# Patient Record
Sex: Male | Born: 1960 | Race: White | Hispanic: No | Marital: Married | Smoking: Former smoker
Health system: Southern US, Community
[De-identification: ages and names within clinical notes are randomized; demographics above are authoritative.]

## PROBLEM LIST (undated history)

## (undated) DIAGNOSIS — Q250 Patent ductus arteriosus: Secondary | ICD-10-CM

## (undated) DIAGNOSIS — G809 Cerebral palsy, unspecified: Secondary | ICD-10-CM

## (undated) DIAGNOSIS — I10 Essential (primary) hypertension: Secondary | ICD-10-CM

## (undated) HISTORY — PX: TOE FUSION: SHX1070

## (undated) HISTORY — PX: FOOT CAPSULE RELEASE W/ PERCUTANEOUS HEEL CORD LENGTHENING, TIBIAL TENDON TRANSFER: SHX1658

## (undated) HISTORY — PX: PATENT DUCTUS ARTERIOUS REPAIR: SHX269

## (undated) HISTORY — PX: HIP SURGERY: SHX245

## (undated) HISTORY — PX: LAPAROSCOPY ABDOMEN DIAGNOSTIC: PRO50

## (undated) HISTORY — PX: OTHER SURGICAL HISTORY: SHX169

## (undated) HISTORY — PX: SURGERY SCROTAL / TESTICULAR: SUR1316

---

## 1998-04-18 ENCOUNTER — Ambulatory Visit (HOSPITAL_COMMUNITY): Admission: RE | Admit: 1998-04-18 | Discharge: 1998-04-18 | Payer: Self-pay | Admitting: Family Medicine

## 2016-03-10 ENCOUNTER — Other Ambulatory Visit: Payer: Self-pay

## 2016-03-10 ENCOUNTER — Encounter (HOSPITAL_BASED_OUTPATIENT_CLINIC_OR_DEPARTMENT_OTHER): Payer: Self-pay | Admitting: *Deleted

## 2016-03-10 ENCOUNTER — Emergency Department (HOSPITAL_BASED_OUTPATIENT_CLINIC_OR_DEPARTMENT_OTHER)
Admission: EM | Admit: 2016-03-10 | Discharge: 2016-03-10 | Disposition: A | Discharge status: Home / Self Care | Payer: 59 | Attending: Emergency Medicine | Admitting: Emergency Medicine

## 2016-03-10 ENCOUNTER — Emergency Department (HOSPITAL_BASED_OUTPATIENT_CLINIC_OR_DEPARTMENT_OTHER): Payer: 59

## 2016-03-10 DIAGNOSIS — Z87891 Personal history of nicotine dependence: Secondary | ICD-10-CM | POA: Diagnosis not present

## 2016-03-10 DIAGNOSIS — I10 Essential (primary) hypertension: Secondary | ICD-10-CM | POA: Diagnosis not present

## 2016-03-10 DIAGNOSIS — R55 Syncope and collapse: Secondary | ICD-10-CM | POA: Diagnosis present

## 2016-03-10 HISTORY — DX: Essential (primary) hypertension: I10

## 2016-03-10 HISTORY — DX: Cerebral palsy, unspecified: G80.9

## 2016-03-10 HISTORY — DX: Patent ductus arteriosus: Q25.0

## 2016-03-10 LAB — COMPREHENSIVE METABOLIC PANEL
ALBUMIN: 4.2 g/dL (ref 3.5–5.0)
ALT: 19 U/L (ref 17–63)
AST: 23 U/L (ref 15–41)
Alkaline Phosphatase: 57 U/L (ref 38–126)
Anion gap: 8 (ref 5–15)
BUN: 24 mg/dL — AB (ref 6–20)
CALCIUM: 9 mg/dL (ref 8.9–10.3)
CO2: 26 mmol/L (ref 22–32)
Chloride: 103 mmol/L (ref 101–111)
Creatinine, Ser: 1.11 mg/dL (ref 0.61–1.24)
GFR calc Af Amer: >60 mL/min (ref >60)
GFR calc non Af Amer: >60 mL/min (ref >60)
GLUCOSE: 101 mg/dL — AB (ref 65–99)
Potassium: 4 mmol/L (ref 3.5–5.1)
SODIUM: 137 mmol/L (ref 135–145)
TOTAL PROTEIN: 7.4 g/dL (ref 6.5–8.1)
Total Bilirubin: 0.9 mg/dL (ref 0.3–1.2)

## 2016-03-10 LAB — TROPONIN I: Troponin I: <0.03 ng/mL (ref <0.031)

## 2016-03-10 LAB — CBC WITH DIFFERENTIAL/PLATELET
BASOS ABS: 0 10*3/uL (ref 0.0–0.1)
BASOS PCT: 0 %
EOS ABS: 0.1 10*3/uL (ref 0.0–0.7)
EOS PCT: 2 %
HCT: 41.8 % (ref 39.0–52.0)
Hemoglobin: 14.6 g/dL (ref 13.0–17.0)
LYMPHS PCT: 21 %
Lymphs Abs: 1.5 10*3/uL (ref 0.7–4.0)
MCH: 28.4 pg (ref 26.0–34.0)
MCHC: 34.9 g/dL (ref 30.0–36.0)
MCV: 81.3 fL (ref 78.0–100.0)
Monocytes Absolute: 0.5 10*3/uL (ref 0.1–1.0)
Monocytes Relative: 7 %
Neutro Abs: 4.8 10*3/uL (ref 1.7–7.7)
Neutrophils Relative %: 70 %
PLATELETS: 185 10*3/uL (ref 150–400)
RBC: 5.14 MIL/uL (ref 4.22–5.81)
RDW: 12.8 % (ref 11.5–15.5)
WBC: 6.8 10*3/uL (ref 4.0–10.5)

## 2016-03-10 LAB — CBG MONITORING, ED: GLUCOSE-CAPILLARY: 103 mg/dL — AB (ref 65–99)

## 2016-03-10 MED ORDER — SODIUM CHLORIDE 0.9 % IV BOLUS (SEPSIS)
1000.0000 mL | Freq: Once | INTRAVENOUS | Status: AC
  Administered 2016-03-10: 1000 mL via INTRAVENOUS

## 2016-03-10 MED ORDER — ACETAMINOPHEN 325 MG PO TABS
650.0000 mg | ORAL_TABLET | Freq: Once | ORAL | Status: AC
  Administered 2016-03-10: 650 mg via ORAL
  Filled 2016-03-10: qty 2

## 2016-03-10 MED ORDER — MECLIZINE HCL 25 MG PO TABS
25.0000 mg | ORAL_TABLET | Freq: Once | ORAL | Status: AC
  Administered 2016-03-10: 25 mg via ORAL
  Filled 2016-03-10: qty 1

## 2016-03-10 MED ORDER — IBUPROFEN 800 MG PO TABS
800.0000 mg | ORAL_TABLET | Freq: Once | ORAL | Status: AC
  Administered 2016-03-10: 800 mg via ORAL
  Filled 2016-03-10: qty 1

## 2016-03-10 MED ORDER — POTASSIUM CHLORIDE CRYS ER 20 MEQ PO TBCR: 40.0000 meq | EXTENDED_RELEASE_TABLET | Freq: Once | ORAL | Status: DC

## 2016-03-10 MED ORDER — MECLIZINE HCL 25 MG PO TABS: 12.5000 mg | ORAL_TABLET | Freq: Three times a day (TID) | ORAL | Status: AC | PRN

## 2016-03-10 NOTE — Discharge Instructions (Signed)
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
## 2016-03-10 NOTE — ED Notes (Signed)
Pt given d/c instructions as per chart. Verbalizes understanding. No questions. Rx x 1 
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
## 2016-03-15 ENCOUNTER — Encounter: Payer: Self-pay | Admitting: Cardiology

## 2016-03-15 ENCOUNTER — Ambulatory Visit (INDEPENDENT_AMBULATORY_CARE_PROVIDER_SITE_OTHER): Payer: 59 | Admitting: Cardiovascular Disease

## 2016-03-15 VITALS — BP 122/88 | HR 57 | Ht 69.0 in | Wt 163.0 lb

## 2016-03-15 DIAGNOSIS — R001 Bradycardia, unspecified: Secondary | ICD-10-CM

## 2016-03-15 DIAGNOSIS — R0789 Other chest pain: Secondary | ICD-10-CM

## 2016-03-15 DIAGNOSIS — R42 Dizziness and giddiness: Secondary | ICD-10-CM

## 2016-03-15 NOTE — Patient Instructions (Addendum)
Medication Instructions:   Your physician recommends that you continue on your current medications as directed. Please refer to the Current Medication list given to you today.   If you need a refill on your cardiac medications before your next appointment, please call your pharmacy.  Labwork: NONE ORDER TODAY    Testing/Procedures: Your physician has requested that you have an echocardiogram. Echocardiography is a painless test that uses sound waves to create images of your heart. It provides your doctor with information about the size and shape of your heart and how well your heart's chambers and valves are working. This procedure takes approximately one hour. There are no restrictions for this procedure.  Your physician has recommended that you wear 48 HOUR a holter monitor. Holter monitors are medical devices that record the heart's electrical activity. Doctors most often use these monitors to diagnose arrhythmias. Arrhythmias are problems with the speed or rhythm of the heartbeat. The monitor is a small, portable device. You can wear one while you do your normal daily activities. This is usually used to diagnose what is causing palpitations/syncope (passing out).    Follow-Up: WITH MCALHANY IN 3 TO 4 WEEKS OR WITH AN AVAILABLE APP ON DAY DR Clifton JamesMCALHANY IN OFFICE    Any Other Special Instructions Will Be Listed Below (If Applicable).
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
## 2016-03-18 ENCOUNTER — Other Ambulatory Visit: Payer: Self-pay | Admitting: Physician Assistant

## 2016-03-18 ENCOUNTER — Ambulatory Visit (INDEPENDENT_AMBULATORY_CARE_PROVIDER_SITE_OTHER): Payer: 59

## 2016-03-18 DIAGNOSIS — R001 Bradycardia, unspecified: Secondary | ICD-10-CM

## 2016-03-18 DIAGNOSIS — R42 Dizziness and giddiness: Secondary | ICD-10-CM

## 2016-03-27 DIAGNOSIS — I10 Essential (primary) hypertension: Secondary | ICD-10-CM | POA: Insufficient documentation

## 2016-03-27 DIAGNOSIS — G809 Cerebral palsy, unspecified: Secondary | ICD-10-CM | POA: Insufficient documentation

## 2016-03-27 DIAGNOSIS — Q250 Patent ductus arteriosus: Secondary | ICD-10-CM | POA: Insufficient documentation

## 2016-04-04 ENCOUNTER — Other Ambulatory Visit (HOSPITAL_COMMUNITY): Payer: 59

## 2016-04-08 ENCOUNTER — Ambulatory Visit (HOSPITAL_COMMUNITY): Payer: 59 | Attending: Cardiology

## 2016-04-08 ENCOUNTER — Other Ambulatory Visit: Payer: Self-pay

## 2016-04-08 DIAGNOSIS — I358 Other nonrheumatic aortic valve disorders: Secondary | ICD-10-CM | POA: Diagnosis not present

## 2016-04-08 DIAGNOSIS — R001 Bradycardia, unspecified: Secondary | ICD-10-CM | POA: Diagnosis not present

## 2016-04-08 DIAGNOSIS — R55 Syncope and collapse: Secondary | ICD-10-CM | POA: Diagnosis present

## 2016-04-08 DIAGNOSIS — I10 Essential (primary) hypertension: Secondary | ICD-10-CM | POA: Insufficient documentation

## 2016-04-08 DIAGNOSIS — R0789 Other chest pain: Secondary | ICD-10-CM | POA: Diagnosis not present

## 2016-04-10 ENCOUNTER — Telehealth: Payer: Self-pay | Admitting: Cardiovascular Disease

## 2016-04-10 NOTE — Telephone Encounter (Signed)
Spoke with pt who reports he received call from our office this AM but missed call.  I checked with scheduling and call was reminder about appt on 04/15/16. I told pt echo had not been reviewed by Dr. Clifton JamesMcAlhany yet as he is out of the office this week. I told him we would call him with results when available.  Pt has appt with Jacolyn ReedyMichele Lenze, PA on 04/15/16. This was to be on day Dr. Clifton JamesMcAlhany was in office.  I explained to pt Dr. Clifton JamesMcAlhany would not be in office that day due to schedule change.  I gave pt option of keeping this appt or changing to day when Dr. Clifton JamesMcAlhany was in office. Pt reports he is feeling well and he would like to change appt.  Appt for 04/15/16 cancelled and appt made for him to see Ronie Spiesayna Dunn, PA on 04/25/16 at 8:30.

## 2016-04-10 NOTE — Telephone Encounter (Signed)
New message    Pt calling to get echo test results. Please call.

## 2016-04-15 ENCOUNTER — Ambulatory Visit: Payer: 59 | Admitting: Physician Assistant

## 2016-04-25 ENCOUNTER — Ambulatory Visit: Payer: 59 | Admitting: Physician Assistant

## 2016-05-22 ENCOUNTER — Ambulatory Visit: Payer: 59 | Admitting: Physician Assistant

## 2016-06-07 ENCOUNTER — Ambulatory Visit: Payer: 59 | Admitting: Physician Assistant

## 2016-06-14 ENCOUNTER — Ambulatory Visit: Payer: 59 | Admitting: Physician Assistant

## 2018-09-07 ENCOUNTER — Encounter: Payer: Self-pay | Admitting: Rheumatology
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
## 2019-07-14 ENCOUNTER — Encounter: Payer: Self-pay | Admitting: Rheumatology

## 2019-07-14 ENCOUNTER — Ambulatory Visit (INDEPENDENT_AMBULATORY_CARE_PROVIDER_SITE_OTHER): Payer: Medicare Other | Admitting: Rheumatology

## 2019-07-14 ENCOUNTER — Other Ambulatory Visit: Payer: Self-pay

## 2019-07-14 VITALS — BP 159/90 | HR 76 | Resp 15 | Ht 69.0 in | Wt 160.8 lb

## 2019-07-14 DIAGNOSIS — G809 Cerebral palsy, unspecified: Secondary | ICD-10-CM | POA: Diagnosis not present

## 2019-07-14 DIAGNOSIS — R21 Rash and other nonspecific skin eruption: Secondary | ICD-10-CM

## 2019-07-14 DIAGNOSIS — Z8719 Personal history of other diseases of the digestive system: Secondary | ICD-10-CM

## 2019-07-14 DIAGNOSIS — Q250 Patent ductus arteriosus: Secondary | ICD-10-CM

## 2019-07-14 DIAGNOSIS — E559 Vitamin D deficiency, unspecified: Secondary | ICD-10-CM

## 2019-07-14 DIAGNOSIS — I10 Essential (primary) hypertension: Secondary | ICD-10-CM | POA: Diagnosis not present

## 2019-07-14 DIAGNOSIS — R7303 Prediabetes: Secondary | ICD-10-CM | POA: Diagnosis not present

## 2019-07-14 DIAGNOSIS — M2550 Pain in unspecified joint: Secondary | ICD-10-CM | POA: Diagnosis not present

## 2019-07-14 DIAGNOSIS — Z87438 Personal history of other diseases of male genital organs: Secondary | ICD-10-CM

## 2019-07-14 DIAGNOSIS — E7800 Pure hypercholesterolemia, unspecified: Secondary | ICD-10-CM

## 2019-07-14 NOTE — Patient Instructions (Signed)
Hand Exercises °Hand exercises can be helpful for almost anyone. These exercises can strengthen the hands, improve flexibility and movement, and increase blood flow to the hands. These results can make work and daily tasks easier. Hand exercises can be especially helpful for people who have joint pain from arthritis or have nerve damage from overuse (carpal tunnel syndrome). These exercises can also help people who have injured a hand. °Exercises °Most of these hand exercises are gentle stretching and motion exercises. It is usually safe to do them often throughout the day. Warming up your hands before exercise may help to reduce stiffness. You can do this with gentle massage or by placing your hands in warm water for 10-15 minutes. °It is normal to feel some stretching, pulling, tightness, or mild discomfort as you begin new exercises. This will gradually improve. Stop an exercise right away if you feel sudden, severe pain or your pain gets worse. Ask your health care provider which exercises are best for you. °Knuckle bend or "claw" fist °1. Stand or sit with your arm, hand, and all five fingers pointed straight up. Make sure to keep your wrist straight during the exercise. °2. Gently bend your fingers down toward your palm until the tips of your fingers are touching the top of your palm. Keep your big knuckle straight and just bend the small knuckles in your fingers. °3. Hold this position for __________ seconds. °4. Straighten (extend) your fingers back to the starting position. °Repeat this exercise 5-10 times with each hand. °Full finger fist °1. Stand or sit with your arm, hand, and all five fingers pointed straight up. Make sure to keep your wrist straight during the exercise. °2. Gently bend your fingers into your palm until the tips of your fingers are touching the middle of your palm. °3. Hold this position for __________ seconds. °4. Extend your fingers back to the starting position, stretching every  joint fully. °Repeat this exercise 5-10 times with each hand. °Straight fist °1. Stand or sit with your arm, hand, and all five fingers pointed straight up. Make sure to keep your wrist straight during the exercise. °2. Gently bend your fingers at the big knuckle, where your fingers meet your hand, and the middle knuckle. Keep the knuckle at the tips of your fingers straight and try to touch the bottom of your palm. °3. Hold this position for __________ seconds. °4. Extend your fingers back to the starting position, stretching every joint fully. °Repeat this exercise 5-10 times with each hand. °Tabletop °1. Stand or sit with your arm, hand, and all five fingers pointed straight up. Make sure to keep your wrist straight during the exercise. °2. Gently bend your fingers at the big knuckle, where your fingers meet your hand, as far down as you can while keeping the small knuckles in your fingers straight. Think of forming a tabletop with your fingers. °3. Hold this position for __________ seconds. °4. Extend your fingers back to the starting position, stretching every joint fully. °Repeat this exercise 5-10 times with each hand. °Finger spread °1. Place your hand flat on a table with your palm facing down. Make sure your wrist stays straight as you do this exercise. °2. Spread your fingers and thumb apart from each other as far as you can until you feel a gentle stretch. Hold this position for __________ seconds. °3. Bring your fingers and thumb tight together again. Hold this position for __________ seconds. °Repeat this exercise 5-10 times with each hand. °  Making circles °1. Stand or sit with your arm, hand, and all five fingers pointed straight up. Make sure to keep your wrist straight during the exercise. °2. Make a circle by touching the tip of your thumb to the tip of your index finger. °3. Hold for __________ seconds. Then open your hand wide. °4. Repeat this motion with your thumb and each finger on your  hand. °Repeat this exercise 5-10 times with each hand. °Thumb motion °1. Sit with your forearm resting on a table and your wrist straight. Your thumb should be facing up toward the ceiling. Keep your fingers relaxed as you move your thumb. °2. Lift your thumb up as high as you can toward the ceiling. Hold for __________ seconds. °3. Bend your thumb across your palm as far as you can, reaching the tip of your thumb for the small finger (pinkie) side of your palm. Hold for __________ seconds. °Repeat this exercise 5-10 times with each hand. °Grip strengthening ° °1. Hold a stress ball or other soft ball in the middle of your hand. °2. Slowly increase the pressure, squeezing the ball as much as you can without causing pain. Think of bringing the tips of your fingers into the middle of your palm. All of your finger joints should bend when doing this exercise. °3. Hold your squeeze for __________ seconds, then relax. °Repeat this exercise 5-10 times with each hand. °Contact a health care provider if: °· Your hand pain or discomfort gets much worse when you do an exercise. °· Your hand pain or discomfort does not improve within 2 hours after you exercise. °If you have any of these problems, stop doing these exercises right away. Do not do them again unless your health care provider says that you can. °Get help right away if: °· You develop sudden, severe hand pain or swelling. If this happens, stop doing these exercises right away. Do not do them again unless your health care provider says that you can. °This information is not intended to replace advice given to you by your health care provider. Make sure you discuss any questions you have with your health care provider. °Document Released: 08/21/2015 Document Revised: 12/31/2018 Document Reviewed: 09/10/2018 °Elsevier Patient Education © 2020 [employer] ° °

## 2019-08-05 ENCOUNTER — Ambulatory Visit: Payer: 59 | Admitting: Rheumatology

## 2021-02-26 DIAGNOSIS — K219 Gastro-esophageal reflux disease without esophagitis: Secondary | ICD-10-CM | POA: Diagnosis not present

## 2021-02-26 DIAGNOSIS — E538 Deficiency of other specified B group vitamins: Secondary | ICD-10-CM | POA: Diagnosis not present

## 2021-02-26 DIAGNOSIS — Z0000 Encounter for general adult medical examination without abnormal findings: Secondary | ICD-10-CM | POA: Diagnosis not present

## 2021-02-26 DIAGNOSIS — E559 Vitamin D deficiency, unspecified: Secondary | ICD-10-CM | POA: Diagnosis not present

## 2021-02-26 DIAGNOSIS — E1169 Type 2 diabetes mellitus with other specified complication: Secondary | ICD-10-CM | POA: Diagnosis not present

## 2021-02-26 DIAGNOSIS — G8929 Other chronic pain: Secondary | ICD-10-CM | POA: Diagnosis not present

## 2021-02-26 DIAGNOSIS — E7800 Pure hypercholesterolemia, unspecified: Secondary | ICD-10-CM | POA: Diagnosis not present

## 2021-02-26 DIAGNOSIS — G809 Cerebral palsy, unspecified: Secondary | ICD-10-CM | POA: Diagnosis not present

## 2021-02-26 DIAGNOSIS — I10 Essential (primary) hypertension: Secondary | ICD-10-CM | POA: Diagnosis not present

## 2021-08-29 DIAGNOSIS — E559 Vitamin D deficiency, unspecified: Secondary | ICD-10-CM | POA: Diagnosis not present

## 2021-08-29 DIAGNOSIS — K219 Gastro-esophageal reflux disease without esophagitis: Secondary | ICD-10-CM | POA: Diagnosis not present

## 2021-08-29 DIAGNOSIS — F5101 Primary insomnia: Secondary | ICD-10-CM | POA: Diagnosis not present

## 2021-08-29 DIAGNOSIS — G8929 Other chronic pain: Secondary | ICD-10-CM | POA: Diagnosis not present

## 2021-08-29 DIAGNOSIS — E1169 Type 2 diabetes mellitus with other specified complication: Secondary | ICD-10-CM | POA: Diagnosis not present

## 2021-08-29 DIAGNOSIS — L739 Follicular disorder, unspecified: Secondary | ICD-10-CM | POA: Diagnosis not present

## 2021-08-29 DIAGNOSIS — E7800 Pure hypercholesterolemia, unspecified: Secondary | ICD-10-CM | POA: Diagnosis not present

## 2021-08-29 DIAGNOSIS — G809 Cerebral palsy, unspecified: Secondary | ICD-10-CM | POA: Diagnosis not present

## 2021-08-29 DIAGNOSIS — E538 Deficiency of other specified B group vitamins: Secondary | ICD-10-CM | POA: Diagnosis not present

## 2021-10-24 ENCOUNTER — Encounter (HOSPITAL_BASED_OUTPATIENT_CLINIC_OR_DEPARTMENT_OTHER): Payer: Self-pay

## 2021-10-24 ENCOUNTER — Emergency Department (HOSPITAL_BASED_OUTPATIENT_CLINIC_OR_DEPARTMENT_OTHER): Payer: Medicare Other

## 2021-10-24 ENCOUNTER — Other Ambulatory Visit: Payer: Self-pay

## 2021-10-24 ENCOUNTER — Emergency Department (HOSPITAL_COMMUNITY): Payer: Medicare Other

## 2021-10-24 ENCOUNTER — Emergency Department (HOSPITAL_BASED_OUTPATIENT_CLINIC_OR_DEPARTMENT_OTHER)
Admission: EM | Admit: 2021-10-24 | Discharge: 2021-10-24 | Disposition: A | Discharge status: Home / Self Care | Payer: Medicare Other | Attending: Emergency Medicine | Admitting: Emergency Medicine

## 2021-10-24 DIAGNOSIS — S43005A Unspecified dislocation of left shoulder joint, initial encounter: Secondary | ICD-10-CM

## 2021-10-24 DIAGNOSIS — R000 Tachycardia, unspecified: Secondary | ICD-10-CM | POA: Diagnosis not present

## 2021-10-24 DIAGNOSIS — W010XXA Fall on same level from slipping, tripping and stumbling without subsequent striking against object, initial encounter: Secondary | ICD-10-CM | POA: Insufficient documentation

## 2021-10-24 DIAGNOSIS — S4992XA Unspecified injury of left shoulder and upper arm, initial encounter: Secondary | ICD-10-CM | POA: Diagnosis present

## 2021-10-24 DIAGNOSIS — S43015A Anterior dislocation of left humerus, initial encounter: Secondary | ICD-10-CM | POA: Insufficient documentation

## 2021-10-24 DIAGNOSIS — S43005D Unspecified dislocation of left shoulder joint, subsequent encounter: Secondary | ICD-10-CM | POA: Diagnosis not present

## 2021-10-24 DIAGNOSIS — Z79899 Other long term (current) drug therapy: Secondary | ICD-10-CM | POA: Diagnosis not present

## 2021-10-24 MED ORDER — FENTANYL CITRATE PF 50 MCG/ML IJ SOSY
50.0000 ug | PREFILLED_SYRINGE | Freq: Once | INTRAMUSCULAR | Status: AC
  Administered 2021-10-24: 50 ug via INTRAVENOUS
  Filled 2021-10-24: qty 1

## 2021-10-24 MED ORDER — PROPOFOL 10 MG/ML IV BOLUS
INTRAVENOUS | Status: AC | PRN
  Administered 2021-10-24: 37 mg via INTRAVENOUS

## 2021-10-24 MED ORDER — MORPHINE SULFATE (PF) 4 MG/ML IV SOLN
4.0000 mg | Freq: Once | INTRAVENOUS | Status: AC
  Administered 2021-10-24: 4 mg via INTRAVENOUS
  Filled 2021-10-24: qty 1

## 2021-10-24 MED ORDER — PROPOFOL 10 MG/ML IV BOLUS
0.5000 mg/kg | Freq: Once | INTRAVENOUS | Status: DC
  Filled 2021-10-24: qty 20

## 2021-10-24 MED ORDER — PROPOFOL 10 MG/ML IV BOLUS: 1.0000 mg/kg | Freq: Once | INTRAVENOUS | Status: DC

## 2021-10-24 MED ORDER — ONDANSETRON HCL 4 MG/2ML IJ SOLN
4.0000 mg | Freq: Once | INTRAMUSCULAR | Status: AC
  Administered 2021-10-24: 4 mg via INTRAVENOUS
  Filled 2021-10-24: qty 2

## 2021-10-24 MED ORDER — LIDOCAINE HCL 2 % IJ SOLN
20.0000 mL | Freq: Once | INTRAMUSCULAR | Status: AC
Start: 2021-10-24 — End: 2021-10-24
  Administered 2021-10-24: 400 mg via INTRADERMAL
  Filled 2021-10-24: qty 20

## 2021-10-24 MED ORDER — PROPOFOL 1000 MG/100ML IV EMUL
INTRAVENOUS | Status: AC
  Filled 2021-10-24: qty 100

## 2021-10-24 MED ORDER — MORPHINE SULFATE (PF) 4 MG/ML IV SOLN
4.0000 mg | Freq: Once | INTRAVENOUS | Status: AC
Start: 2021-10-24 — End: 2021-10-24
  Administered 2021-10-24: 4 mg via INTRAVENOUS
  Filled 2021-10-24: qty 1

## 2021-10-24 MED ORDER — PROPOFOL 10 MG/ML IV BOLUS
INTRAVENOUS | Status: AC
  Administered 2021-10-24: 37 mg
  Filled 2021-10-24: qty 20

## 2021-10-24 MED ORDER — PROPOFOL 10 MG/ML IV BOLUS
INTRAVENOUS | Status: AC | PRN
  Administered 2021-10-24: 20 mg via INTRAVENOUS
  Administered 2021-10-24: 80 mg via INTRAVENOUS

## 2021-10-24 MED ORDER — LACTATED RINGERS IV BOLUS
500.0000 mL | Freq: Once | INTRAVENOUS | Status: AC
  Administered 2021-10-24: 500 mL via INTRAVENOUS

## 2021-10-24 NOTE — ED Notes (Signed)
Patient verbalizes understanding of d/c instructions. Opportunities for questions and answers were provided. Pt d/c from ED and wheeled to lobby where wife is picking pt up. ?

## 2021-10-24 NOTE — Progress Notes (Signed)
Orthopedic Tech Progress Note Patient Details:  Russell Parker [birth year] [id number]  Ortho Devices Type of Ortho Device: Shoulder immobilizer Ortho Device/Splint Location: LUE Ortho Device/Splint Interventions: Ordered, Application, Adjustment   Post Interventions Patient Tolerated: Well Instructions Provided: Adjustment of device, Care of device, Poper ambulation with device  [name] 10/24/2021, 8:30 PM

## 2021-10-24 NOTE — ED Notes (Signed)
ED Provider at bedside. 
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
## 2021-10-24 NOTE — ED Provider Notes (Signed)
°  Physical Exam  BP (!) 169/99 (BP Location: Right Arm)    Pulse 98    Temp 99 F (37.2 C)    Resp 16    Ht 5\' 9"  (1.753 m)    Wt 74.8 kg    SpO2 96%    BMI 24.37 kg/m     Procedures  .Sedation  Date/Time: 10/24/2021 8:12 PM Performed by: 12/22/2021, MD Authorized by: Ernie Avena, MD   Consent:    Consent obtained:  Verbal   Consent given by:  Patient   Risks discussed:  Allergic reaction, prolonged hypoxia resulting in organ damage, prolonged sedation necessitating reversal, dysrhythmia, nausea, vomiting, respiratory compromise necessitating ventilatory assistance and intubation and inadequate sedation Universal protocol:    Immediately prior to procedure, a time out was called: no     Patient identity confirmed:  Arm band, provided demographic data and verbally with patient Indications:    Procedure performed:  Dislocation reduction   Procedure necessitating sedation performed by:  Different physician Pre-sedation assessment:    Time since last food or drink:  Hours   ASA classification: class 2 - patient with mild systemic disease     Mallampati score:  I - soft palate, uvula, fauces, pillars visible   Neck mobility: normal     Pre-sedation assessments completed and reviewed: airway patency, cardiovascular function, hydration status, mental status, nausea/vomiting, pain level, respiratory function and temperature     Pre-sedation assessment completed:  10/24/2021 8:00 PM Immediate pre-procedure details:    Reassessment: Patient reassessed immediately prior to procedure     Reviewed: vital signs, relevant labs/tests and NPO status     Verified: bag valve mask available, emergency equipment available, intubation equipment available, IV patency confirmed, oxygen available, reversal medications available and suction available   Procedure details (see MAR for exact dosages):    Preoxygenation:  Nasal cannula   Sedation:  Propofol   Intended level of sedation: deep   Analgesia:   None   Intra-procedure monitoring:  Blood pressure monitoring, cardiac monitor, continuous capnometry, continuous pulse oximetry, frequent LOC assessments and frequent vital sign checks   Intra-procedure events: none     Total Provider sedation time (minutes):  6 Post-procedure details:    Post-sedation assessment completed:  10/24/2021 8:13 PM   Attendance: Constant attendance by certified staff until patient recovered     Recovery: Patient returned to pre-procedure baseline     Post-sedation assessments completed and reviewed: airway patency, cardiovascular function, hydration status, mental status, nausea/vomiting, pain level and respiratory function     Patient is stable for discharge or admission: yes     Procedure completion:  Tolerated well, no immediate complications   ED Course / MDM    Medical Decision Making Amount and/or Complexity of Data Reviewed Radiology: ordered.  Risk Prescription drug management.   61 year old male presenting with left shoulder dislocation, difficulty with reduction at outside hospital.  Orthopedics was consulted and was present bedside for reduction.  The patient was consented for procedural sedation for reduction of shoulder dislocation which was accomplished as per procedure note above without incident.      67, MD 10/24/21 2014

## 2021-10-24 NOTE — ED Notes (Addendum)
Pt still talking, verbal order for more propofol. Unable to assess pain. Had to pause BP in order to push propofol

## 2021-10-24 NOTE — ED Provider Notes (Signed)
.  Sedation  Date/Time: 10/24/2021 1:43 PM Performed by: Pollyann Savoy, MD Authorized by: Pollyann Savoy, MD   Consent:    Consent obtained:  Verbal and written   Consent given by:  Patient   Risks discussed:  Prolonged hypoxia resulting in organ damage, prolonged sedation necessitating reversal and inadequate sedation Universal protocol:    Immediately prior to procedure, a time out was called: yes   Pre-sedation assessment:    Time since last food or drink:  4   ASA classification: class 2 - patient with mild systemic disease     Mouth opening:  3 or more finger widths   Mallampati score:  II - soft palate, uvula, fauces visible   Neck mobility: normal     Pre-sedation assessments completed and reviewed: airway patency, cardiovascular function, mental status, nausea/vomiting, pain level, respiratory function and temperature     Pre-sedation assessment completed:  10/24/2021 1:25 PM Immediate pre-procedure details:    Reassessment: Patient reassessed immediately prior to procedure     Reviewed: vital signs, relevant labs/tests and NPO status     Verified: bag valve mask available, emergency equipment available, intubation equipment available, IV patency confirmed, oxygen available and suction available   Procedure details (see MAR for exact dosages):    Preoxygenation:  Nasal cannula   Sedation:  Propofol   Intended level of sedation: deep   Analgesia:  Fentanyl   Intra-procedure monitoring:  Blood pressure monitoring, cardiac monitor, continuous capnometry, continuous pulse oximetry, frequent LOC assessments and frequent vital sign checks   Intra-procedure events: none     Total Provider sedation time (minutes):  15 Post-procedure details:    Post-sedation assessment completed:  10/24/2021 1:44 PM   Attendance: Constant attendance by certified staff until patient recovered     Recovery: Patient returned to pre-procedure baseline     Patient is stable for discharge or admission:  yes     Procedure completion:  Tolerated well, no immediate complications    Pollyann Savoy, MD 10/24/21 1345

## 2021-10-24 NOTE — Discharge Instructions (Signed)
Please follow-up with Dr. Eulah Pont he will see you in the office.  Please wear the sling until you are seen in follow-up.  Please use Tylenol or ibuprofen for pain.  You may use 600 mg ibuprofen every 6 hours or 1000 mg of Tylenol every 6 hours.  You may choose to alternate between the 2.  This would be most effective.  Not to exceed 4 g of Tylenol within 24 hours.  Not to exceed 3200 mg ibuprofen 24 hours.

## 2021-10-24 NOTE — ED Notes (Addendum)
Pt less talkative. EDP starting to manipulate arm. Unable to assess pain

## 2021-10-24 NOTE — ED Triage Notes (Signed)
States mechanical fall last night. C/o left shoulder pain. Deformity noted. Pulses and sensation intact.

## 2021-10-24 NOTE — ED Provider Notes (Signed)
Patient is sent over by team at [facility] for orthopedic consultation they have attempted conscious sedation and left shoulder reduction x2 were unsuccessful  Patient has good sensation movement of fingers and good radial artery pulse in left upper extremity.  Consult placed to orthopedic surgery seems that Edmonia Lynch was consulted earlier.  Also given 1 dose of morphine as his pain is returning and worsening.  Seems that he last ate around 8 AM this morning   Procedural sedation was done by Dr. Armandina Gemma please see his separate note. Dr. Percell Miller successfully reduced L shoulder dislocation.   Pt discharged after discussion with Dr. Percell Miller who patient will follow-up with.  Distally neurovascularly intact after procedure.   Russell Parker, [place] 10/24/21 2105    Regan Lemming, MD 10/25/21 [phone]

## 2021-10-24 NOTE — ED Notes (Signed)
Pt BIB [facility] from [facility]. Per report from [place]- pt arrived to [facility] today, pt fell and tripped last night, has L shoulder dislocation. Attempted closed reduction at [facility], unsuccessful. Meds given at [facility]- 111 mg propofol given, 4mg  morphine, 100 mcg fentanyl, 4mg  zofran.

## 2021-10-24 NOTE — ED Notes (Addendum)
Propofol first dose IV push

## 2021-10-24 NOTE — ED Notes (Addendum)
EDP unable to reduce dislocation left shoulder at this time . Pt alert and responding appropriately.

## 2021-10-24 NOTE — ED Provider Notes (Signed)
[facility] EMERGENCY DEPARTMENT Provider Note   CSN: [id number] Arrival date & time: 10/24/21  9735     History  Chief Complaint  Patient presents with   Shoulder Injury    Russell Parker is a 61 y.o. male.  The history is provided by the patient and a relative.  Shoulder Injury   Patient presents with left shoulder pain.  Happened acutely yesterday when he tripped and fell catching himself with his left hand causing pain.  Denies any numbness or tingling to the left hand, feels the shoulder is out of place, tried putting it back but failed.  History of right shoulder dislocation but never on the left.  He is left-hand dominant, denies any previous surgeries to the left shoulder.  When he fell it was mechanical, did not hit his head or neck.  Left-hand-dominant.  Home Medications Prior to Admission medications   Medication Sig Start Date End Date Taking? Authorizing Provider  amLODipine (NORVASC) 5 MG tablet Take 5 mg by mouth daily. Reported on 03/15/2016 03/11/16   [provider]  B Complex Vitamins (VITAMIN B-COMPLEX PO) Take by mouth daily.    [provider]  Calcium Carbonate Antacid (ANTACID PO) Take by mouth as needed.    [provider]  cetirizine (ZYRTEC) 10 MG tablet Take 10 mg by mouth as needed for allergies.    [provider]  Cholecalciferol (VITAMIN D3) 125 MCG (5000 UT) CAPS Take by mouth daily.    [provider]  cyclobenzaprine (FLEXERIL) 10 MG tablet Take 10 mg by mouth at bedtime as needed for muscle spasms. Reported on 03/15/2016 12/22/15   [provider]  esomeprazole (NEXIUM) 40 MG capsule Take 40 mg by mouth as needed (REFLUX). Reported on 03/15/2016    [provider]  gabapentin (NEURONTIN) 100 MG capsule 3 (three) times daily. 05/27/19   [provider]  Glucosamine HCl 1500 MG TABS Take by mouth daily.    [provider]  HYDROcodone-acetaminophen (NORCO) 10-325  MG tablet Take 1 tablet by mouth every 4 (four) hours as needed for moderate pain or severe pain. Reported on 03/15/2016 12/22/15   [provider]  loperamide (IMODIUM) 2 MG capsule Take by mouth as needed for diarrhea or loose stools.    [provider]  meclizine (ANTIVERT) 25 MG tablet Take 0.5 tablets (12.5 mg total) by mouth 3 (three) times daily as needed for dizziness. Patient not taking: Reported on 07/14/2019 03/10/16   Cheri Fowler, PA-C  oxyCODONE-acetaminophen (PERCOCET) 10-325 MG tablet Take 1 tablet by mouth every 4 (four) hours as needed for pain. Reported on 03/15/2016 12/22/15   [provider]  PARoxetine (PAXIL) 10 MG tablet Take 10 mg by mouth daily.    [provider]  PARoxetine (PAXIL) 20 MG tablet Take 20 mg by mouth daily as needed (ANXIETY). Reported on 03/15/2016 03/11/16   [provider]  predniSONE (DELTASONE) 10 MG tablet Take 10 mg by mouth daily with breakfast.    [provider]  sildenafil (VIAGRA) 100 MG tablet TAKE 1 TABLET BY MOUTH ONCE DAILY AS NEEDED FOR ERECTILE DYSFUNCTION 06/23/19   [provider]  tiZANidine (ZANAFLEX) 4 MG tablet as needed. 06/25/19   [provider]  TURMERIC CURCUMIN PO Take by mouth daily.    [provider]  Vitamin D, Ergocalciferol, (DRISDOL) 50000 units CAPS capsule Take 50,000 Units by mouth every 7 (seven) days. Reported on 03/15/2016 03/02/16   [provider]  zolpidem (AMBIEN) 5 MG tablet Take 5 mg by mouth at bedtime. Reported on 03/15/2016 02/27/16   [provider]      Allergies    Penicillins    Review of Systems   Review of Systems  Musculoskeletal:  Positive for myalgias.   Physical Exam Updated Vital Signs BP (!) 167/87    Pulse (!) 106    Temp 98.3 F (36.8 C) (Oral)    Resp 11    Ht 5\' 9"  (1.753 m)    Wt 74.8 kg    SpO2 96%    BMI 24.37 kg/m  Physical Exam Vitals and nursing note reviewed. Exam conducted with a chaperone  present.  Constitutional:      General: He is not in acute distress.    Appearance: Normal appearance.  HENT:     Head: Normocephalic and atraumatic.  Eyes:     General: No scleral icterus.    Extraocular Movements: Extraocular movements intact.     Pupils: Pupils are equal, round, and reactive to light.  Cardiovascular:     Rate and Rhythm: Regular rhythm. Tachycardia present.     Pulses: Normal pulses.  Musculoskeletal:        General: Deformity present.  Skin:    Capillary Refill: Capillary refill takes less than 2 seconds.     Coloration: Skin is not jaundiced.  Neurological:     Mental Status: He is alert. Mental status is at baseline.     Coordination: Coordination normal.    ED Results / Procedures / Treatments   Labs (all labs ordered are listed, but only abnormal results are displayed) Labs Reviewed - No data to display  EKG None  Radiology DG Shoulder Left  Result Date: 10/24/2021 CLINICAL DATA:  Fall.  Shoulder injury EXAM: LEFT SHOULDER - 2+ VIEW COMPARISON:  None. FINDINGS: Anterior dislocation of the shoulder. Probable small Hill-Sachs deformity. Otherwise no fracture identified. IMPRESSION: Anterior dislocation shoulder with small Hill-Sachs deformity. Electronically Signed   By: Marlan Palauharles  Clark M.D.   On: 10/24/2021 10:22    Procedures Reduction of dislocation  Date/Time: 10/24/2021 1:42 PM Performed by: Theron AristaSage, Excell Neyland, PA-C Authorized by: Pollyann SavoySheldon, Charles B, MD  Consent: Verbal consent obtained. Risks and benefits: risks, benefits and alternatives were discussed Consent given by: patient Patient understanding: patient states understanding of the procedure being performed Patient consent: the patient's understanding of the procedure matches consent given Procedure consent: procedure consent matches procedure scheduled Relevant documents: relevant documents present and verified Test results: test results available and properly labeled Site marked: the  operative site was marked Imaging studies: imaging studies available Required items: required blood products, implants, devices, and special equipment available Patient identity confirmed: verbally with patient Time out: Immediately prior to procedure a "time out" was called to verify the correct patient, procedure, equipment, support staff and site/side marked as required. Preparation: Patient was prepped and draped in the usual sterile fashion. Local anesthesia used: yes Anesthesia: hematoma block  Anesthesia: Local anesthesia used: yes Local Anesthetic: lidocaine 2% without epinephrine Anesthetic total: 20 mL  Sedation: Patient sedated: no  Patient tolerance: patient tolerated the procedure well with no immediate complications Comments: Unsuccessful reduction attempt      Medications Ordered in ED Medications  fentaNYL (SUBLIMAZE) injection 50 mcg (50 mcg Intravenous Given 10/24/21 1209)  lidocaine (XYLOCAINE) 2 % (with pres) injection 400 mg (400 mg Intradermal Given by Other 10/24/21 1208)  ondansetron (ZOFRAN) injection 4 mg (4 mg Intravenous Given 10/24/21 1209)  propofol (DIPRIVAN)  10 mg/mL bolus/IV push (37 mg  Given 10/24/21 1316)  fentaNYL (SUBLIMAZE) injection 50 mcg (50 mcg Intravenous Given 10/24/21 1317)  propofol (DIPRIVAN) 10 mg/mL bolus/IV push (37 mg Intravenous Given 10/24/21 1319)  propofol (DIPRIVAN) 10 mg/mL bolus/IV push (37 mg Intravenous Given 10/24/21 1322)    ED Course/ Medical Decision Making/ A&P                           Medical Decision Making Amount and/or Complexity of Data Reviewed Radiology: ordered.  Risk Prescription drug management.   This patient presents to the ED for concern of left shoulder pain, this involves an extensive number of treatment options, and is a complaint that carries with it a high risk of complications and morbidity.  The differential diagnosis includes dislocation, fracture, other   Co morbidities that complicate the  patient evaluation: cerebral palsy    Additional history obtained: -Additional history obtained from patient family member at bedside -External records from outside source obtained and reviewed including: Chart review including previous notes, labs, imaging, consultation notes   Imaging Studies ordered: -I ordered imaging studies including shoulder x-ray -I independently visualized and interpreted imaging which showed Anterior dislocation shoulder with small Hill-Sachs deformity. -I agree with the radiologist interpretation   Medicines ordered and prescription drug management: -I ordered medication including propofol, fentanyl, Zofran, lidocaine for pain/reduction attempt   -Reevaluation of the patient after these medicines showed that the patient improved -I have reviewed the patients home medicines and have made adjustments as needed   Consultations Obtained: I requested consultation with the orthopedic surgery Dr. Eulah Pont,  and discussed lab and imaging findings as well as pertinent plan - they recommend: Transfer to [facility], advised we can try her third time if patient is willing.   ED Course: Patient is a 72-year-old male presenting with left shoulder dislocation.  He is neurovascularly intact, strong radial pulse and good cap refill.  Shoulder is been out for greater than 12 hours, attempted reduction with hematoma block and then under conscious sedation.  Both attempts were unsuccessful, will consult orthopedic surgery for their recommendations.      Cardiac Monitoring: The patient was maintained on a cardiac monitor.  I personally viewed and interpreted the cardiac monitored which showed an underlying rhythm of: Sinus tachycardia    Reevaluation: After the interventions noted above, I reevaluated the patient and found that they have :stayed the same   Dispostion: Plan to send patient [facility] to [facility] for reduction.  Dr. Margarita Rana with orthopedics is aware,  plan to notify orthopedic group once patient arrives at the ED.  Ortho will need to evaluate in the ED, I suspect patient will need to be brought to the OR but orthopedics may desire to try with conscious sedation in the ED.  Discussed HPI, physical exam and plan of care for this patient with attending Susy Frizzle. The attending physician evaluated this patient as part of a shared visit and agrees with plan of care.          Final Clinical Impression(s) / ED Diagnoses Final diagnoses:  Shoulder dislocation, left, initial encounter    Rx / DC Orders ED Discharge Orders     None         Theron Arista, PA-C 10/24/21 1437    Pollyann Savoy, MD 10/25/21 [phone]

## 2021-10-24 NOTE — ED Notes (Signed)
ED tech attempted to get oral temp, but was unable due to talking

## 2021-10-24 NOTE — ED Notes (Signed)
[facility] in to transport and report called to karen at [facility]

## 2021-10-26 DIAGNOSIS — M7522 Bicipital tendinitis, left shoulder: Secondary | ICD-10-CM | POA: Diagnosis not present

## 2021-10-26 DIAGNOSIS — M25512 Pain in left shoulder: Secondary | ICD-10-CM | POA: Diagnosis not present

## 2021-11-14 DIAGNOSIS — M25312 Other instability, left shoulder: Secondary | ICD-10-CM | POA: Diagnosis not present

## 2021-11-14 DIAGNOSIS — M75122 Complete rotator cuff tear or rupture of left shoulder, not specified as traumatic: Secondary | ICD-10-CM | POA: Diagnosis not present

## 2021-11-28 DIAGNOSIS — M25512 Pain in left shoulder: Secondary | ICD-10-CM | POA: Diagnosis not present

## 2021-12-26 DIAGNOSIS — M75122 Complete rotator cuff tear or rupture of left shoulder, not specified as traumatic: Secondary | ICD-10-CM | POA: Diagnosis not present

## 2021-12-26 DIAGNOSIS — M25312 Other instability, left shoulder: Secondary | ICD-10-CM | POA: Diagnosis not present

## 2022-03-11 DIAGNOSIS — E7800 Pure hypercholesterolemia, unspecified: Secondary | ICD-10-CM | POA: Diagnosis not present

## 2022-03-11 DIAGNOSIS — G809 Cerebral palsy, unspecified: Secondary | ICD-10-CM | POA: Diagnosis not present

## 2022-03-11 DIAGNOSIS — I10 Essential (primary) hypertension: Secondary | ICD-10-CM | POA: Diagnosis not present

## 2022-03-11 DIAGNOSIS — F5101 Primary insomnia: Secondary | ICD-10-CM | POA: Diagnosis not present

## 2022-03-11 DIAGNOSIS — E559 Vitamin D deficiency, unspecified: Secondary | ICD-10-CM | POA: Diagnosis not present

## 2022-03-11 DIAGNOSIS — Z0000 Encounter for general adult medical examination without abnormal findings: Secondary | ICD-10-CM | POA: Diagnosis not present

## 2022-03-11 DIAGNOSIS — E538 Deficiency of other specified B group vitamins: Secondary | ICD-10-CM | POA: Diagnosis not present

## 2022-03-11 DIAGNOSIS — E1169 Type 2 diabetes mellitus with other specified complication: Secondary | ICD-10-CM | POA: Diagnosis not present

## 2022-06-05 DIAGNOSIS — L821 Other seborrheic keratosis: Secondary | ICD-10-CM | POA: Diagnosis not present

## 2022-06-05 DIAGNOSIS — L814 Other melanin hyperpigmentation: Secondary | ICD-10-CM | POA: Diagnosis not present

## 2022-06-05 DIAGNOSIS — L738 Other specified follicular disorders: Secondary | ICD-10-CM | POA: Diagnosis not present

## 2022-06-05 DIAGNOSIS — L280 Lichen simplex chronicus: Secondary | ICD-10-CM | POA: Diagnosis not present

## 2022-06-05 DIAGNOSIS — L720 Epidermal cyst: Secondary | ICD-10-CM | POA: Diagnosis not present

## 2022-08-14 DIAGNOSIS — E538 Deficiency of other specified B group vitamins: Secondary | ICD-10-CM | POA: Diagnosis not present

## 2022-09-08 IMAGING — CT CT SHOULDER*L* W/O CM
2 of 3 series · 13 of 20 positions shown, 16 images · non-contrast
Comparison: 10/24/2021

CLINICAL DATA: Anterior glenohumeral dislocation, tripped and fell,
unsuccessful closed reduction attempt

EXAM:
CT OF THE UPPER LEFT EXTREMITY WITHOUT CONTRAST
TECHNIQUE: Multidetector CT imaging of the upper left extremity was performed
according to the standard protocol.
RADIATION DOSE REDUCTION: This exam was performed according to the
departmental dose-optimization program which includes automated
exposure control, adjustment of the mA and/or kV according to
patient size and/or use of iterative reconstruction technique.

[Series 6: soft tissue · axial · 0.52mm/px · z∈[+532,+672]mm · 12 of 84 slices shown, 15 images]
[im 7/84  soft-tissue]
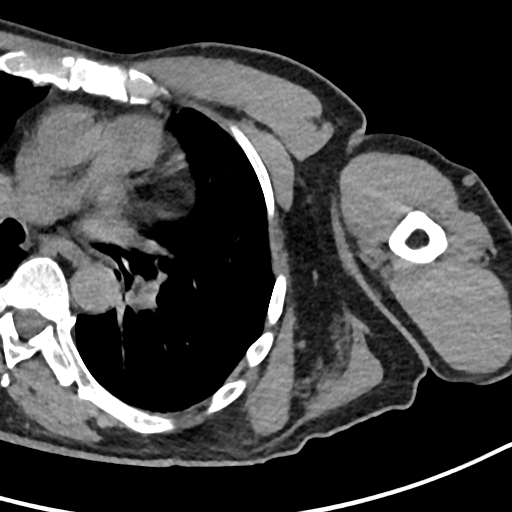
[im 7/84  bone]
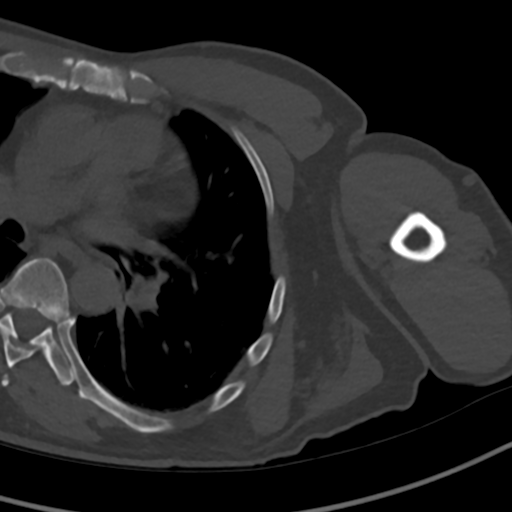
[im 13/84  bone]
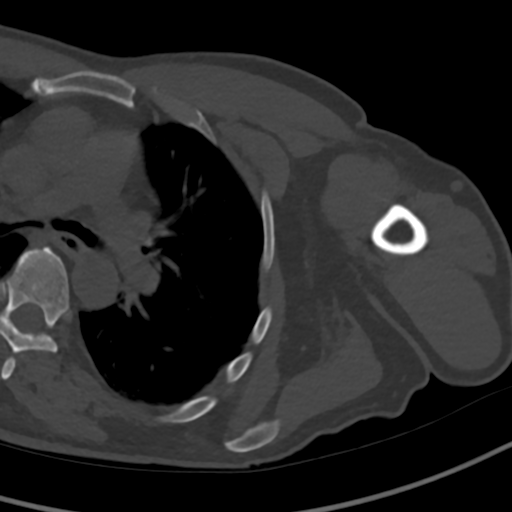
[im 20/84  bone]
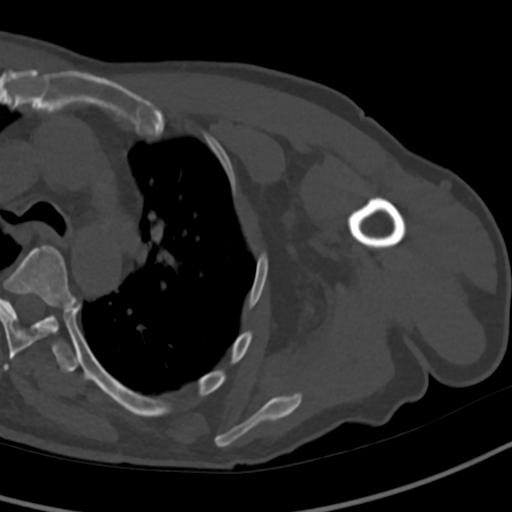
[im 26/84  bone]
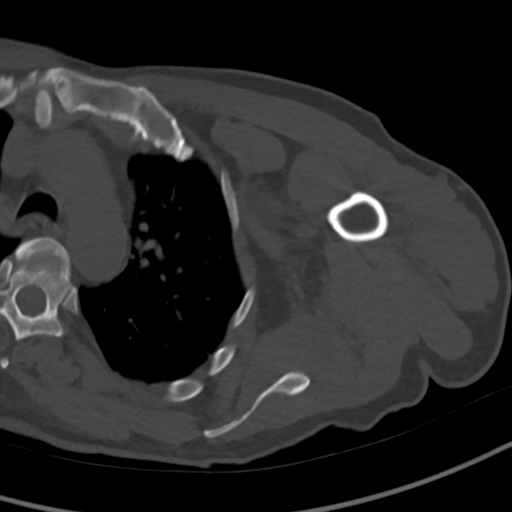
[im 32/84  soft-tissue]
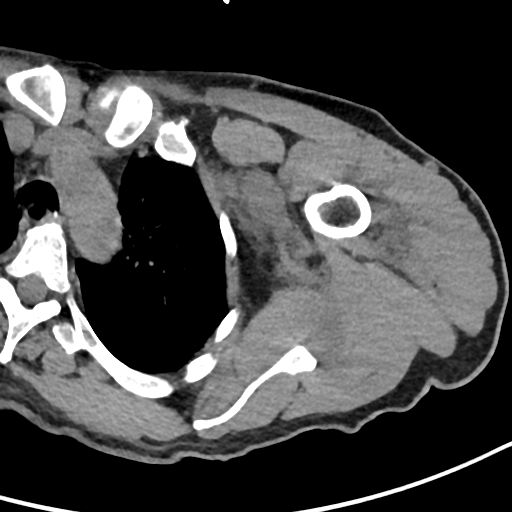
[im 32/84  bone]
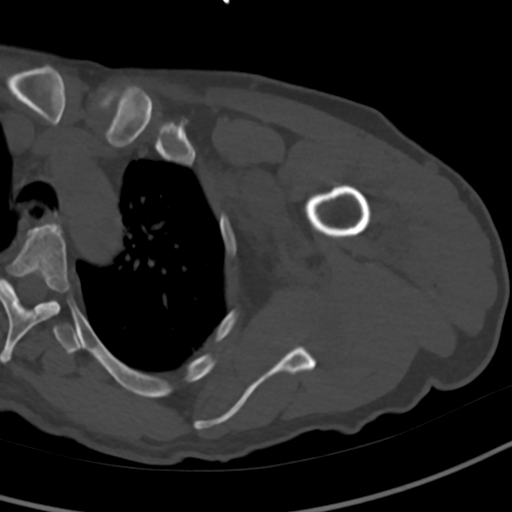
[im 39/84  bone]
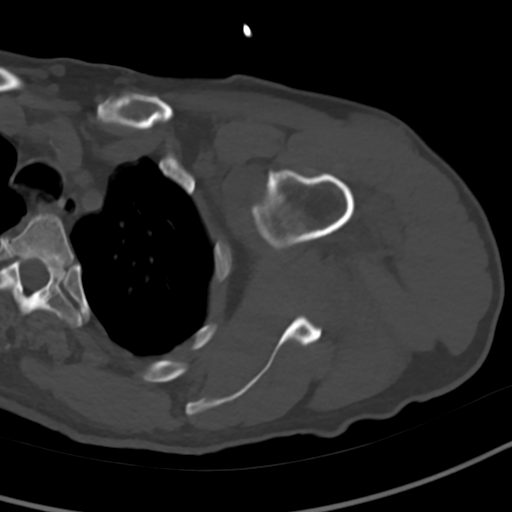
[im 45/84  bone]
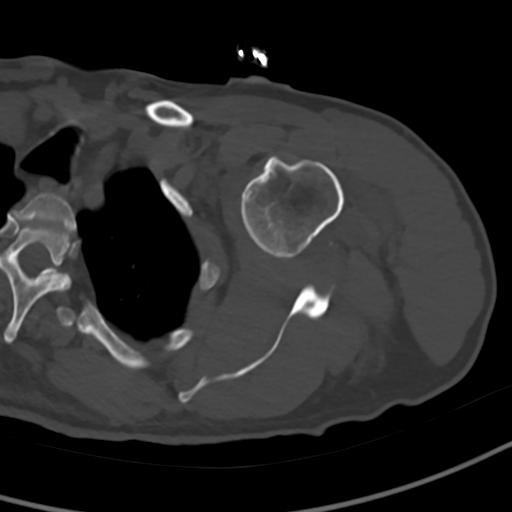
[im 52/84  bone]
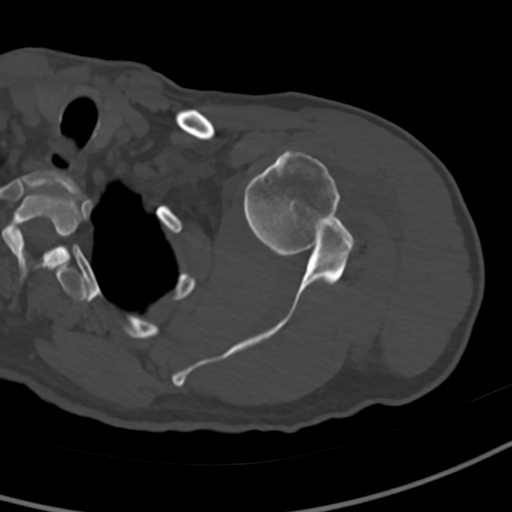
[im 58/84  soft-tissue]
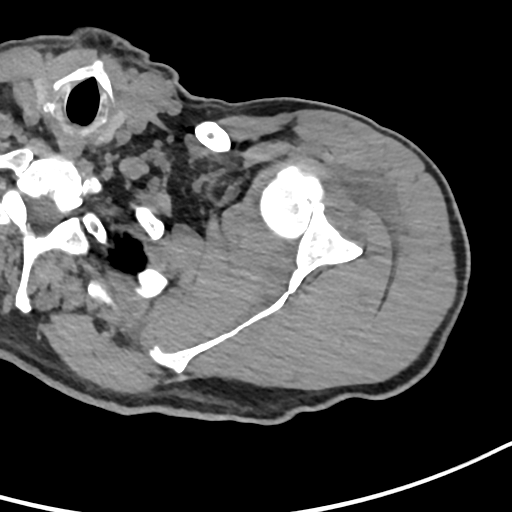
[im 58/84  bone]
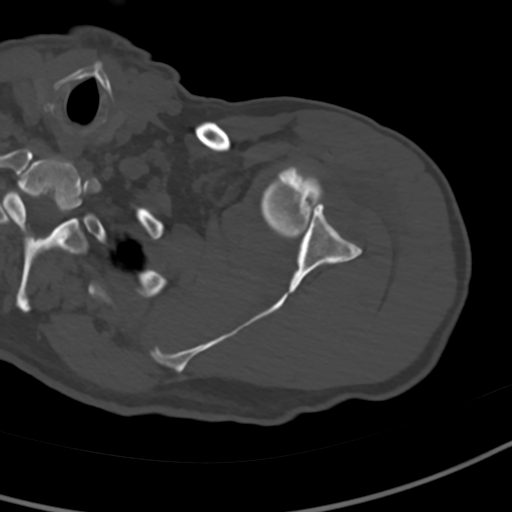
[im 64/84  bone]
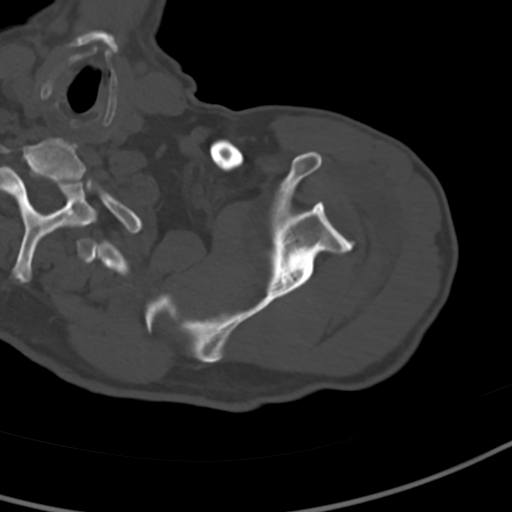
[im 71/84  bone]
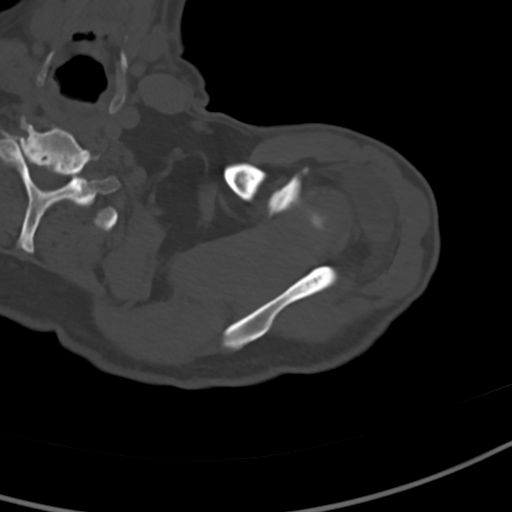
[im 77/84  bone]
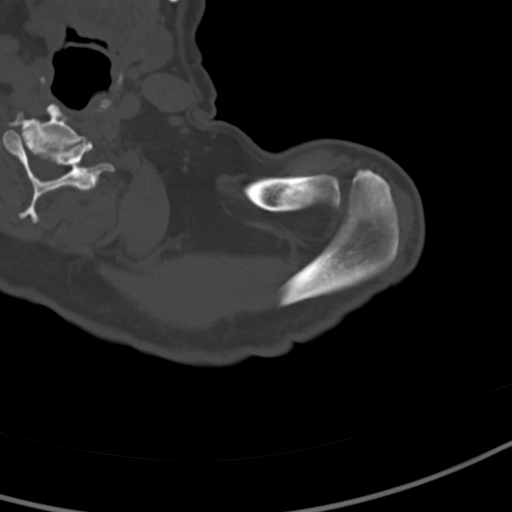

[Series 8: sag soft · coronal · 0.32mm/px · 1 of 138 slices shown]
[im 69/138  bone]
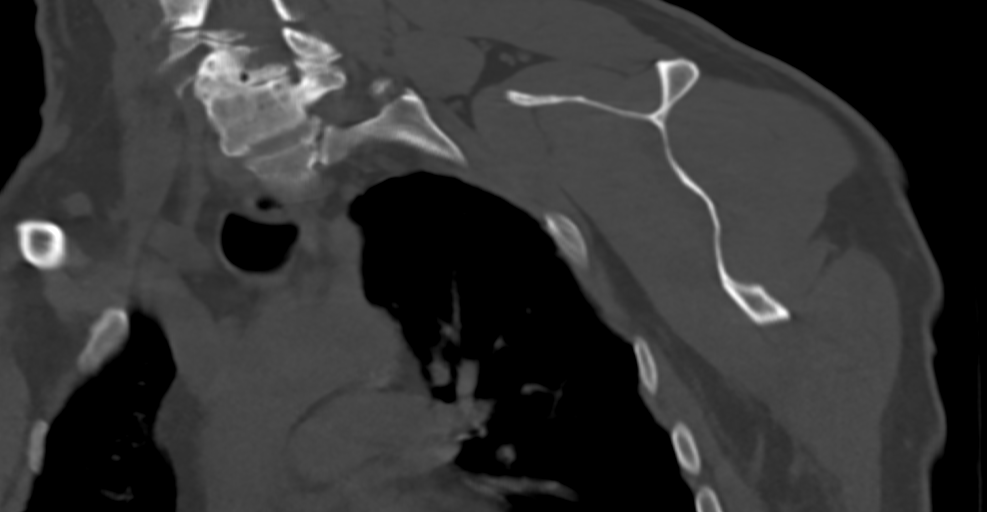

[13 of 20 positions shown; findings below may reference images not displayed]

FINDINGS: Bones/Joint/Cartilage

There is anterior dislocation of the left glenohumeral joint.
Cortical irregularity posterior aspect of the humeral head resting
on the anterior margin of the glenoid, consistent with Hill-Sachs
deformity. I do not see any evidence of bony Bankart lesion.

No other acute displaced fractures. Anatomic alignment of the
acromioclavicular joint, with mild hypertrophic change.

Ligaments

Suboptimally assessed by CT.

Muscles and Tendons

No gross abnormalities on this unenhanced CT. Fluid-filled bursa
identified in the subacromial subdeltoid region and subscapular
region.

Soft tissues

There is soft tissue edema within the left axillary region and deep
to the left clavicle, without evidence of fluid collection or
hematoma. Evaluation of the soft tissues is limited without IV
contrast.

Visualized portions of the left chest are clear. Reconstructed
images demonstrate no additional findings.
IMPRESSION: 1. Anterior glenohumeral dislocation, with Hill-Sachs deformity
posterior margin of the humeral head.
2. No other acute displaced fractures.
3. Soft tissue edema medial to the dislocated humeral head within
the left axillary and supraclavicular regions.

## 2022-09-08 IMAGING — DX DG SHOULDER 1V*L*
1 series · 2 of 2 positions shown · non-contrast
Comparison: 10/24/2021 at 5880 hours

CLINICAL DATA: Shoulder reduction

EXAM:
LEFT SHOULDER

[Series 1: shoulder · 0.14mm/px · 2 of 2 slices shown]
[im 1/2]
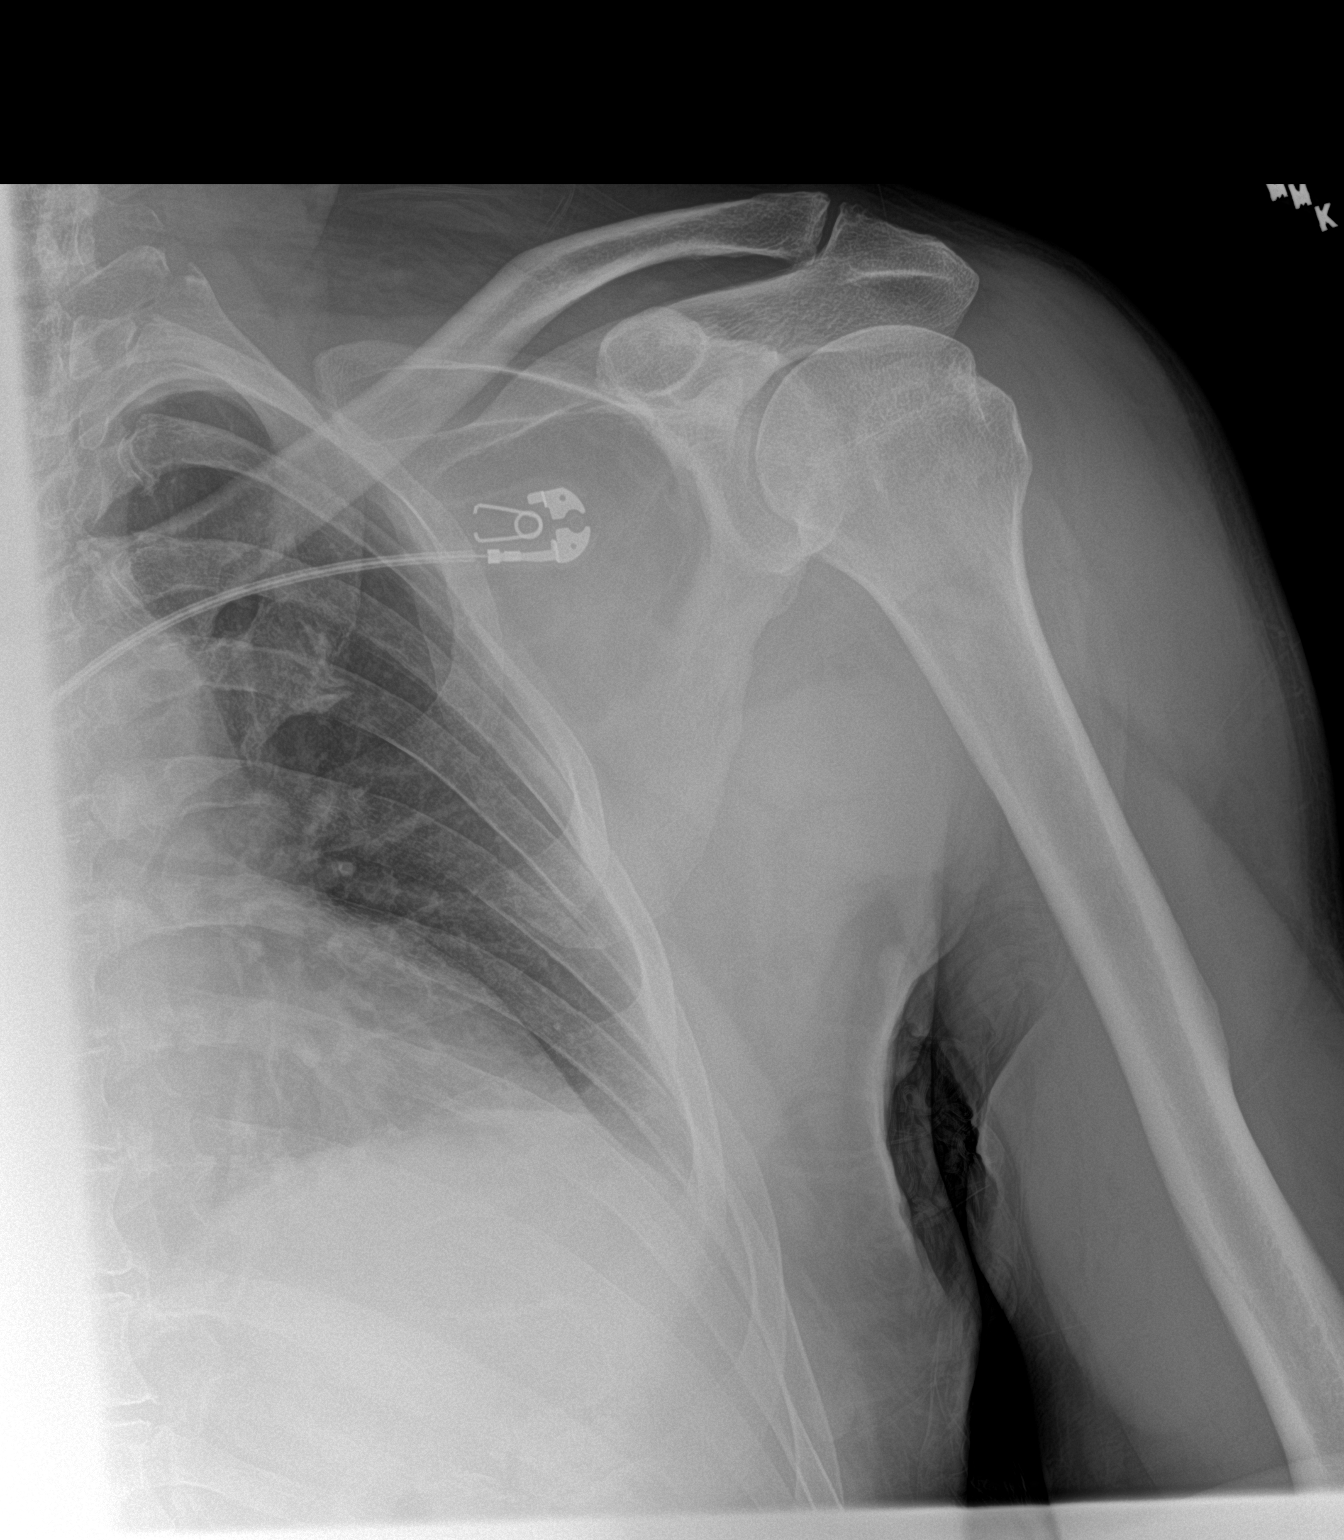
[im 2/2]
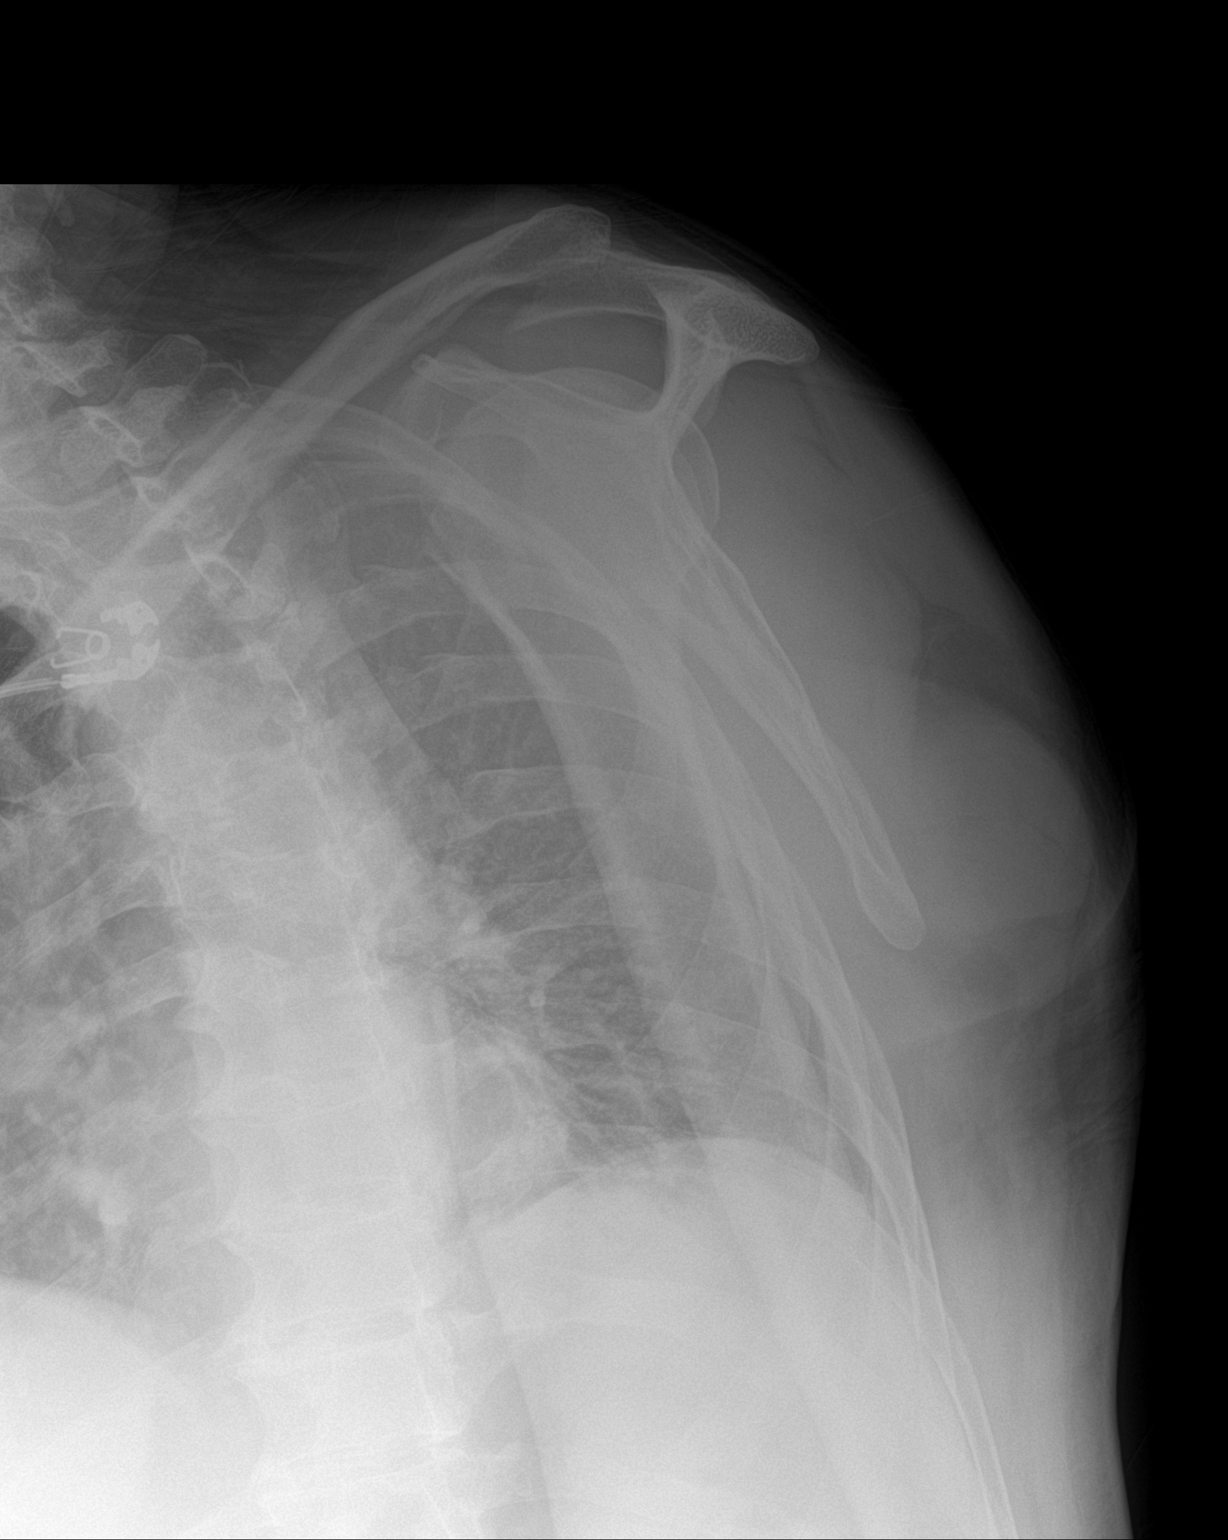

[2 of 2 positions shown; findings below may reference images not displayed]

FINDINGS: Interval reduction of the humeral head in the glenoid fossa.

Hill-Sachs deformity of the posterolateral humeral head. Otherwise,
no fracture is seen.

The visualized soft tissues are unremarkable.

Visualized left lung is clear.
IMPRESSION: Interval left shoulder reduction.

## 2022-09-08 IMAGING — DX DG SHOULDER 2+V*L*
3 series · 3 of 3 positions shown · non-contrast
Comparison: None.

CLINICAL DATA: Fall.  Shoulder injury

EXAM:
LEFT SHOULDER - 2+ VIEW

[shoulder grashey]
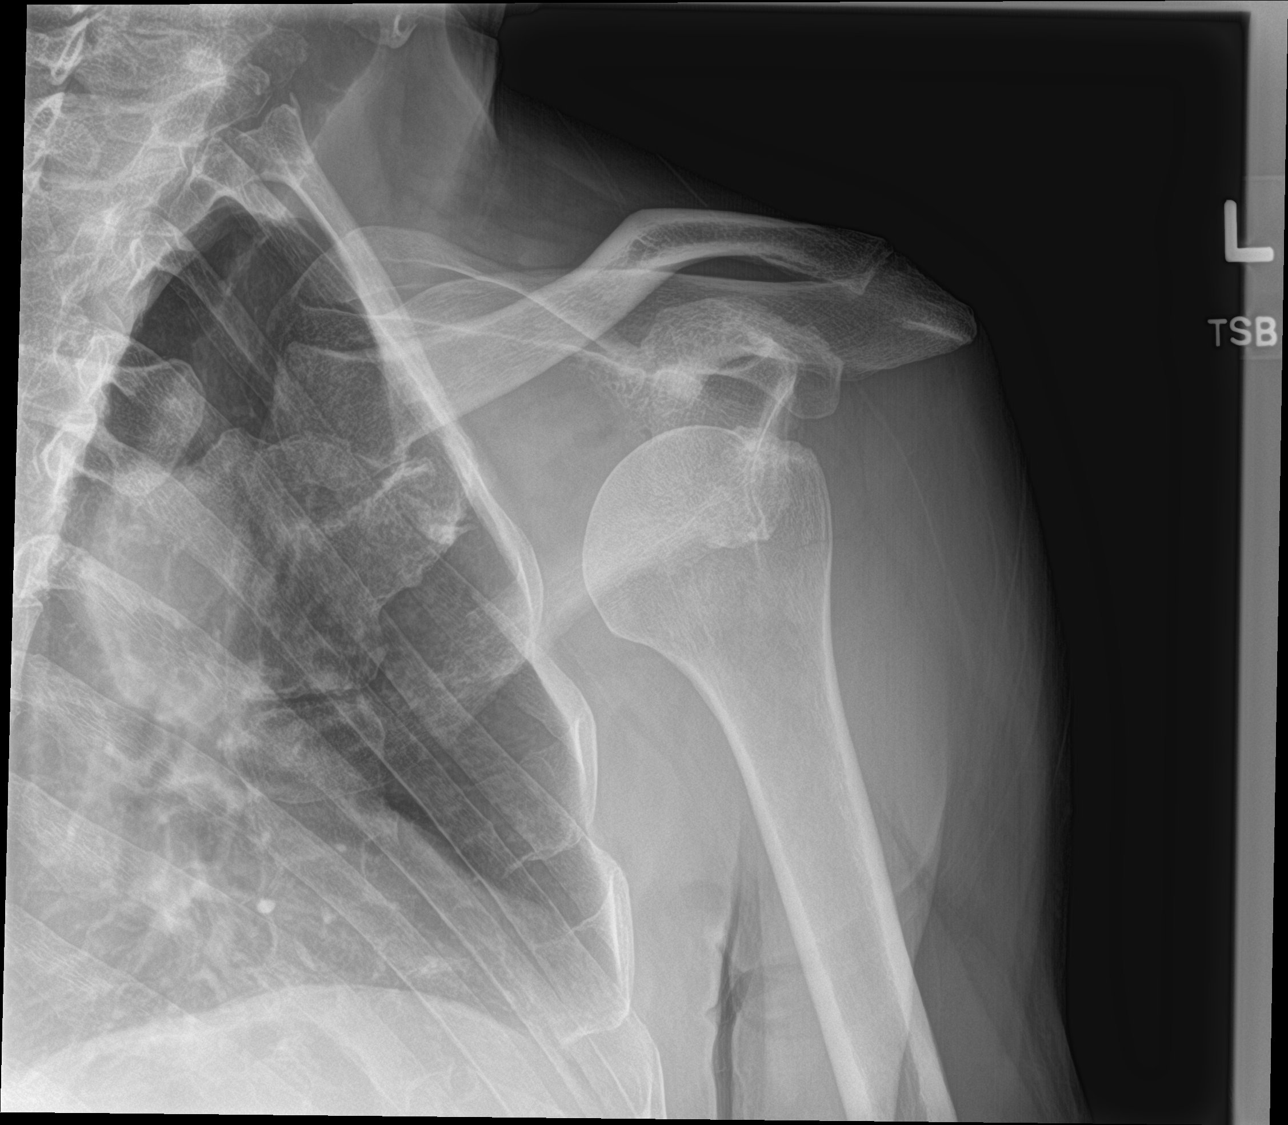

[shoulder ap neutral]
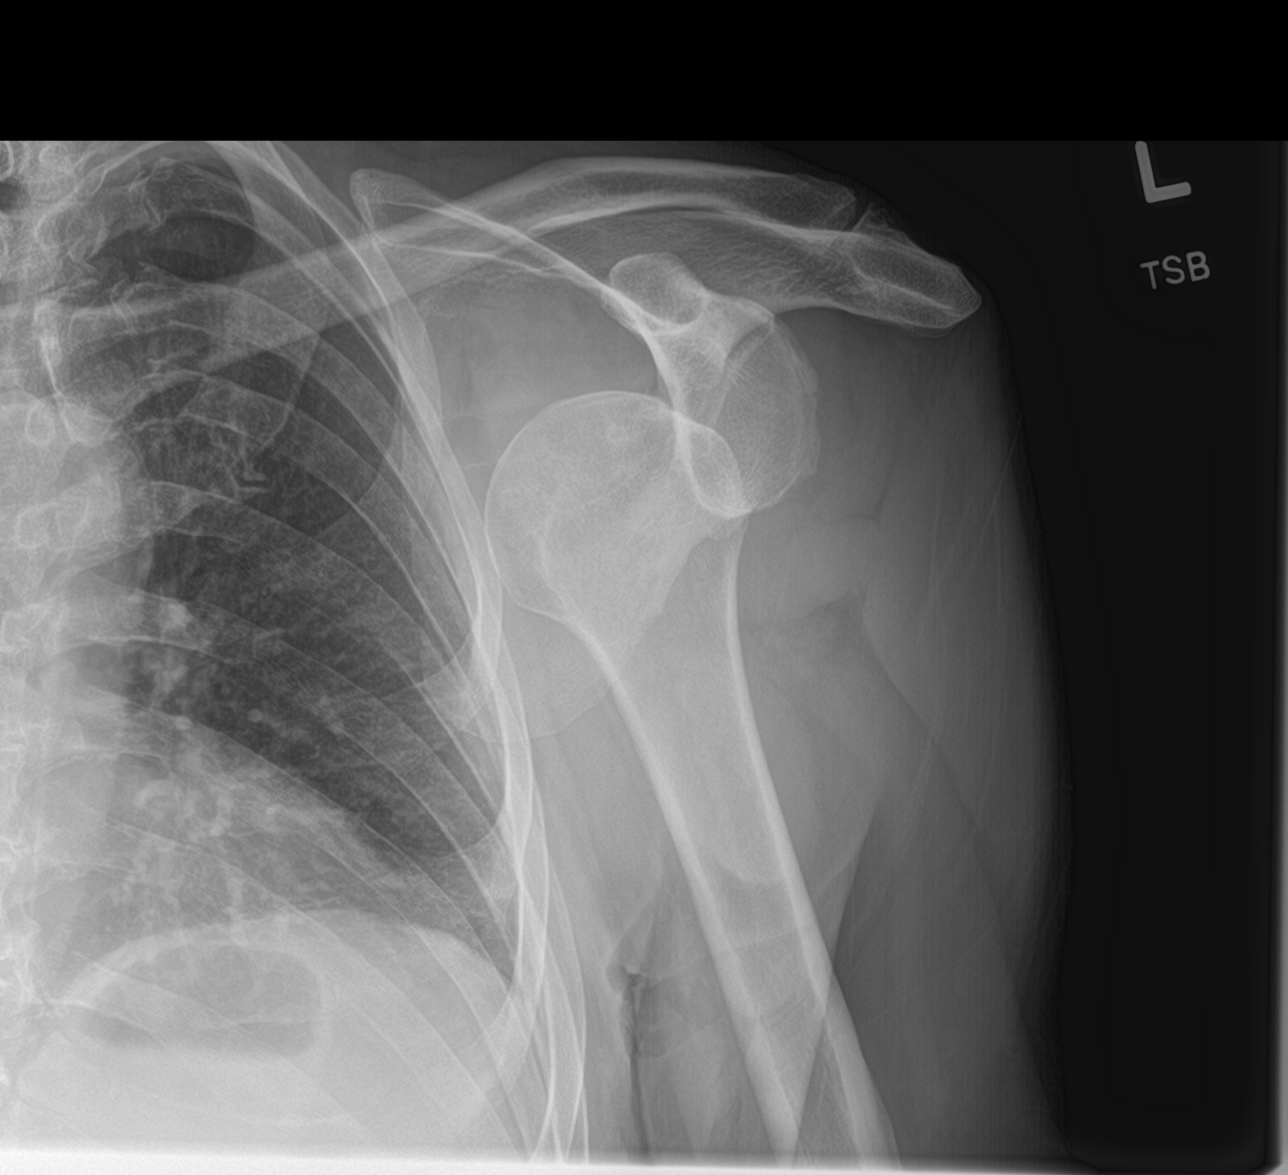

[shoulder y view]
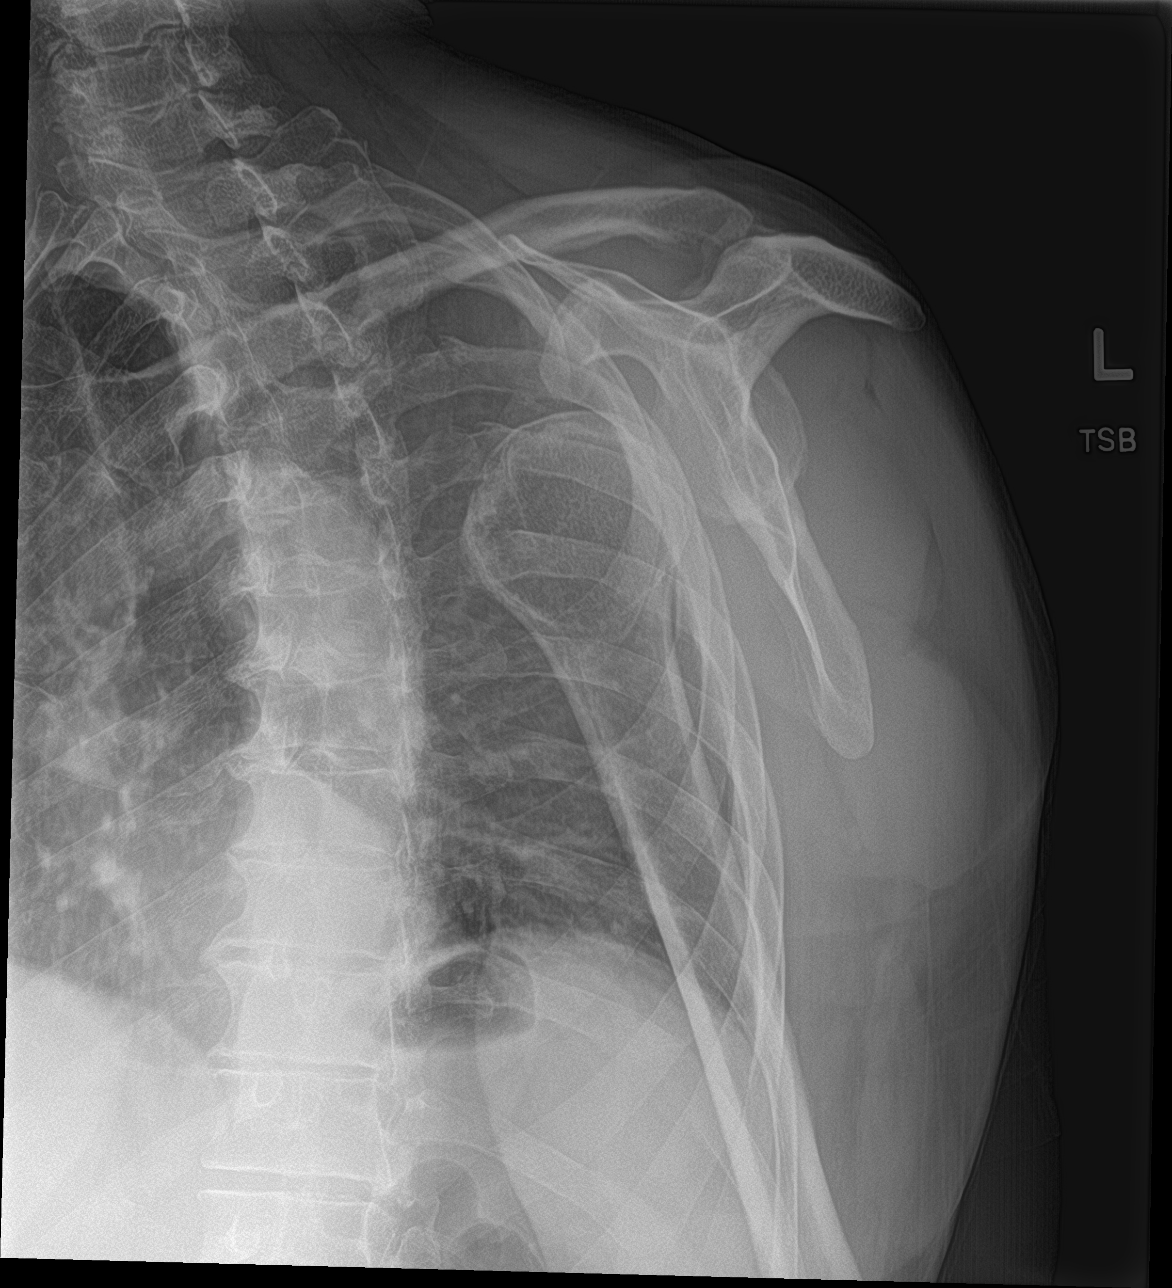

[3 of 3 positions shown; findings below may reference images not displayed]

FINDINGS: Anterior dislocation of the shoulder. Probable small Hill-Sachs
deformity. Otherwise no fracture identified.
IMPRESSION: Anterior dislocation shoulder with small Hill-Sachs deformity.

## 2022-10-14 DIAGNOSIS — E538 Deficiency of other specified B group vitamins: Secondary | ICD-10-CM | POA: Diagnosis not present

## 2022-10-14 DIAGNOSIS — K219 Gastro-esophageal reflux disease without esophagitis: Secondary | ICD-10-CM | POA: Diagnosis not present

## 2022-10-14 DIAGNOSIS — E7800 Pure hypercholesterolemia, unspecified: Secondary | ICD-10-CM | POA: Diagnosis not present

## 2022-10-14 DIAGNOSIS — G809 Cerebral palsy, unspecified: Secondary | ICD-10-CM | POA: Diagnosis not present

## 2022-10-14 DIAGNOSIS — F5101 Primary insomnia: Secondary | ICD-10-CM | POA: Diagnosis not present

## 2022-10-14 DIAGNOSIS — I10 Essential (primary) hypertension: Secondary | ICD-10-CM | POA: Diagnosis not present

## 2022-10-14 DIAGNOSIS — E559 Vitamin D deficiency, unspecified: Secondary | ICD-10-CM | POA: Diagnosis not present

## 2022-10-14 DIAGNOSIS — E1169 Type 2 diabetes mellitus with other specified complication: Secondary | ICD-10-CM | POA: Diagnosis not present

## 2022-10-14 DIAGNOSIS — G8929 Other chronic pain: Secondary | ICD-10-CM | POA: Diagnosis not present

## 2023-01-03 DIAGNOSIS — Z1211 Encounter for screening for malignant neoplasm of colon: Secondary | ICD-10-CM | POA: Diagnosis not present

## 2023-01-03 DIAGNOSIS — K635 Polyp of colon: Secondary | ICD-10-CM | POA: Diagnosis not present

## 2023-01-03 DIAGNOSIS — Z86010 Personal history of colonic polyps: Secondary | ICD-10-CM | POA: Diagnosis not present

## 2023-01-03 DIAGNOSIS — D123 Benign neoplasm of transverse colon: Secondary | ICD-10-CM | POA: Diagnosis not present

## 2023-01-03 DIAGNOSIS — Z09 Encounter for follow-up examination after completed treatment for conditions other than malignant neoplasm: Secondary | ICD-10-CM | POA: Diagnosis not present

## 2023-01-03 DIAGNOSIS — I10 Essential (primary) hypertension: Secondary | ICD-10-CM | POA: Diagnosis not present

## 2023-03-20 DIAGNOSIS — Z0000 Encounter for general adult medical examination without abnormal findings: Secondary | ICD-10-CM | POA: Diagnosis not present

## 2023-03-20 DIAGNOSIS — E7800 Pure hypercholesterolemia, unspecified: Secondary | ICD-10-CM | POA: Diagnosis not present

## 2023-03-20 DIAGNOSIS — G809 Cerebral palsy, unspecified: Secondary | ICD-10-CM | POA: Diagnosis not present

## 2023-03-20 DIAGNOSIS — K219 Gastro-esophageal reflux disease without esophagitis: Secondary | ICD-10-CM | POA: Diagnosis not present

## 2023-03-20 DIAGNOSIS — E559 Vitamin D deficiency, unspecified: Secondary | ICD-10-CM | POA: Diagnosis not present

## 2023-03-20 DIAGNOSIS — I10 Essential (primary) hypertension: Secondary | ICD-10-CM | POA: Diagnosis not present

## 2023-03-20 DIAGNOSIS — E1165 Type 2 diabetes mellitus with hyperglycemia: Secondary | ICD-10-CM | POA: Diagnosis not present

## 2023-03-20 DIAGNOSIS — Z23 Encounter for immunization: Secondary | ICD-10-CM | POA: Diagnosis not present

## 2023-03-20 DIAGNOSIS — E538 Deficiency of other specified B group vitamins: Secondary | ICD-10-CM | POA: Diagnosis not present

## 2023-05-23 DIAGNOSIS — E538 Deficiency of other specified B group vitamins: Secondary | ICD-10-CM | POA: Diagnosis not present

## 2023-06-16 DIAGNOSIS — E538 Deficiency of other specified B group vitamins: Secondary | ICD-10-CM | POA: Diagnosis not present

## 2023-06-18 DIAGNOSIS — E1169 Type 2 diabetes mellitus with other specified complication: Secondary | ICD-10-CM | POA: Diagnosis not present

## 2023-08-15 DIAGNOSIS — E538 Deficiency of other specified B group vitamins: Secondary | ICD-10-CM | POA: Diagnosis not present

## 2023-09-15 DIAGNOSIS — E538 Deficiency of other specified B group vitamins: Secondary | ICD-10-CM | POA: Diagnosis not present

## 2023-10-09 DIAGNOSIS — E538 Deficiency of other specified B group vitamins: Secondary | ICD-10-CM | POA: Diagnosis not present

## 2023-10-09 DIAGNOSIS — K219 Gastro-esophageal reflux disease without esophagitis: Secondary | ICD-10-CM | POA: Diagnosis not present

## 2023-10-09 DIAGNOSIS — F5101 Primary insomnia: Secondary | ICD-10-CM | POA: Diagnosis not present

## 2023-10-09 DIAGNOSIS — E1165 Type 2 diabetes mellitus with hyperglycemia: Secondary | ICD-10-CM | POA: Diagnosis not present

## 2023-10-09 DIAGNOSIS — E559 Vitamin D deficiency, unspecified: Secondary | ICD-10-CM | POA: Diagnosis not present

## 2023-10-09 DIAGNOSIS — L309 Dermatitis, unspecified: Secondary | ICD-10-CM | POA: Diagnosis not present

## 2023-10-09 DIAGNOSIS — E7800 Pure hypercholesterolemia, unspecified: Secondary | ICD-10-CM | POA: Diagnosis not present

## 2023-10-09 DIAGNOSIS — G8929 Other chronic pain: Secondary | ICD-10-CM | POA: Diagnosis not present

## 2023-10-09 DIAGNOSIS — I10 Essential (primary) hypertension: Secondary | ICD-10-CM | POA: Diagnosis not present

## 2023-11-01 DIAGNOSIS — J208 Acute bronchitis due to other specified organisms: Secondary | ICD-10-CM | POA: Diagnosis not present

## 2023-11-01 DIAGNOSIS — R062 Wheezing: Secondary | ICD-10-CM | POA: Diagnosis not present

## 2023-11-01 DIAGNOSIS — B9689 Other specified bacterial agents as the cause of diseases classified elsewhere: Secondary | ICD-10-CM | POA: Diagnosis not present

## 2023-11-14 DIAGNOSIS — E538 Deficiency of other specified B group vitamins: Secondary | ICD-10-CM | POA: Diagnosis not present

## 2023-12-12 DIAGNOSIS — E538 Deficiency of other specified B group vitamins: Secondary | ICD-10-CM | POA: Diagnosis not present

## 2024-01-12 DIAGNOSIS — E538 Deficiency of other specified B group vitamins: Secondary | ICD-10-CM | POA: Diagnosis not present

## 2024-02-11 DIAGNOSIS — E538 Deficiency of other specified B group vitamins: Secondary | ICD-10-CM | POA: Diagnosis not present

## 2024-03-12 DIAGNOSIS — E538 Deficiency of other specified B group vitamins: Secondary | ICD-10-CM | POA: Diagnosis not present

## 2024-04-01 DIAGNOSIS — E7800 Pure hypercholesterolemia, unspecified: Secondary | ICD-10-CM | POA: Diagnosis not present

## 2024-04-01 DIAGNOSIS — E559 Vitamin D deficiency, unspecified: Secondary | ICD-10-CM | POA: Diagnosis not present

## 2024-04-01 DIAGNOSIS — E1165 Type 2 diabetes mellitus with hyperglycemia: Secondary | ICD-10-CM | POA: Diagnosis not present

## 2024-04-01 DIAGNOSIS — E538 Deficiency of other specified B group vitamins: Secondary | ICD-10-CM | POA: Diagnosis not present

## 2024-04-01 DIAGNOSIS — I10 Essential (primary) hypertension: Secondary | ICD-10-CM | POA: Diagnosis not present

## 2024-04-15 DIAGNOSIS — G8929 Other chronic pain: Secondary | ICD-10-CM | POA: Diagnosis not present

## 2024-04-15 DIAGNOSIS — G809 Cerebral palsy, unspecified: Secondary | ICD-10-CM | POA: Diagnosis not present

## 2024-04-15 DIAGNOSIS — E7800 Pure hypercholesterolemia, unspecified: Secondary | ICD-10-CM | POA: Diagnosis not present

## 2024-04-15 DIAGNOSIS — K219 Gastro-esophageal reflux disease without esophagitis: Secondary | ICD-10-CM | POA: Diagnosis not present

## 2024-04-15 DIAGNOSIS — E538 Deficiency of other specified B group vitamins: Secondary | ICD-10-CM | POA: Diagnosis not present

## 2024-04-15 DIAGNOSIS — Z0000 Encounter for general adult medical examination without abnormal findings: Secondary | ICD-10-CM | POA: Diagnosis not present

## 2024-04-15 DIAGNOSIS — R5383 Other fatigue: Secondary | ICD-10-CM | POA: Diagnosis not present

## 2024-04-15 DIAGNOSIS — E559 Vitamin D deficiency, unspecified: Secondary | ICD-10-CM | POA: Diagnosis not present

## 2024-04-15 DIAGNOSIS — I10 Essential (primary) hypertension: Secondary | ICD-10-CM | POA: Diagnosis not present

## 2024-04-15 DIAGNOSIS — F5101 Primary insomnia: Secondary | ICD-10-CM | POA: Diagnosis not present

## 2024-04-15 DIAGNOSIS — E1169 Type 2 diabetes mellitus with other specified complication: Secondary | ICD-10-CM | POA: Diagnosis not present

## 2024-05-14 DIAGNOSIS — E538 Deficiency of other specified B group vitamins: Secondary | ICD-10-CM | POA: Diagnosis not present

## 2024-06-14 DIAGNOSIS — E538 Deficiency of other specified B group vitamins: Secondary | ICD-10-CM | POA: Diagnosis not present

## 2024-07-14 DIAGNOSIS — E7800 Pure hypercholesterolemia, unspecified: Secondary | ICD-10-CM | POA: Diagnosis not present

## 2024-07-14 DIAGNOSIS — E559 Vitamin D deficiency, unspecified: Secondary | ICD-10-CM | POA: Diagnosis not present

## 2024-07-14 DIAGNOSIS — R5383 Other fatigue: Secondary | ICD-10-CM | POA: Diagnosis not present

## 2024-07-14 DIAGNOSIS — E538 Deficiency of other specified B group vitamins: Secondary | ICD-10-CM | POA: Diagnosis not present

## 2024-07-14 DIAGNOSIS — E1169 Type 2 diabetes mellitus with other specified complication: Secondary | ICD-10-CM | POA: Diagnosis not present
